# Patient Record
Sex: Female | Born: 1970 | Race: Black or African American | Hispanic: No | Marital: Married
Health system: Southern US, Community
[De-identification: ages and names within clinical notes are randomized; demographics above are authoritative.]

## PROBLEM LIST (undated history)

## (undated) DIAGNOSIS — I10 Essential (primary) hypertension: Secondary | ICD-10-CM

## (undated) DIAGNOSIS — F419 Anxiety disorder, unspecified: Secondary | ICD-10-CM

---

## 2020-07-07 ENCOUNTER — Other Ambulatory Visit: Payer: Self-pay

## 2020-07-07 ENCOUNTER — Emergency Department
Admission: EM | Admit: 2020-07-07 | Discharge: 2020-07-07 | Disposition: A | Discharge status: Home / Self Care | Payer: Medicaid Other | Attending: Emergency Medicine | Admitting: Emergency Medicine

## 2020-07-07 ENCOUNTER — Encounter: Payer: Self-pay | Admitting: Emergency Medicine

## 2020-07-07 ENCOUNTER — Emergency Department: Payer: Medicaid Other

## 2020-07-07 DIAGNOSIS — R0602 Shortness of breath: Secondary | ICD-10-CM | POA: Insufficient documentation

## 2020-07-07 DIAGNOSIS — I10 Essential (primary) hypertension: Secondary | ICD-10-CM | POA: Diagnosis not present

## 2020-07-07 DIAGNOSIS — R42 Dizziness and giddiness: Secondary | ICD-10-CM | POA: Insufficient documentation

## 2020-07-07 DIAGNOSIS — F419 Anxiety disorder, unspecified: Secondary | ICD-10-CM

## 2020-07-07 DIAGNOSIS — R079 Chest pain, unspecified: Secondary | ICD-10-CM | POA: Diagnosis not present

## 2020-07-07 HISTORY — DX: Anxiety disorder, unspecified: F41.9

## 2020-07-07 HISTORY — DX: Essential (primary) hypertension: I10

## 2020-07-07 LAB — COMPREHENSIVE METABOLIC PANEL
ALT: 31 U/L (ref 0–44)
AST: 33 U/L (ref 15–41)
Albumin: 3.7 g/dL (ref 3.5–5.0)
Alkaline Phosphatase: 71 U/L (ref 38–126)
Anion gap: 8 (ref 5–15)
BUN: 13 mg/dL (ref 6–20)
CO2: 27 mmol/L (ref 22–32)
Calcium: 9 mg/dL (ref 8.9–10.3)
Chloride: 103 mmol/L (ref 98–111)
Creatinine, Ser: 0.88 mg/dL (ref 0.44–1.00)
GFR, Estimated: >60 mL/min (ref >60)
Glucose, Bld: 181 mg/dL — ABNORMAL HIGH (ref 70–99)
Potassium: 3.1 mmol/L — ABNORMAL LOW (ref 3.5–5.1)
Sodium: 138 mmol/L (ref 135–145)
Total Bilirubin: 0.4 mg/dL (ref 0.3–1.2)
Total Protein: 7.3 g/dL (ref 6.5–8.1)

## 2020-07-07 LAB — CBC
HCT: 38.9 % (ref 36.0–46.0)
Hemoglobin: 12.6 g/dL (ref 12.0–15.0)
MCH: 24.2 pg — ABNORMAL LOW (ref 26.0–34.0)
MCHC: 32.4 g/dL (ref 30.0–36.0)
MCV: 74.7 fL — ABNORMAL LOW (ref 80.0–100.0)
Platelets: 346 10*3/uL (ref 150–400)
RBC: 5.21 MIL/uL — ABNORMAL HIGH (ref 3.87–5.11)
RDW: 15.8 % — ABNORMAL HIGH (ref 11.5–15.5)
WBC: 13.1 10*3/uL — ABNORMAL HIGH (ref 4.0–10.5)
nRBC: 0 % (ref 0.0–0.2)

## 2020-07-07 LAB — TROPONIN I (HIGH SENSITIVITY): Troponin I (High Sensitivity): 4 ng/L (ref <18)

## 2020-07-07 MED ORDER — LORAZEPAM 2 MG/ML IJ SOLN
1.0000 mg | Freq: Once | INTRAMUSCULAR | Status: AC
  Administered 2020-07-07: 1 mg via INTRAVENOUS
  Filled 2020-07-07: qty 1

## 2020-07-07 NOTE — ED Notes (Signed)
Patient now resting with even and unlabored respirations. Patient reports improvement to SOB.

## 2020-07-07 NOTE — ED Notes (Addendum)
Vital signs stable.  Patient resting, denies pain.

## 2020-07-07 NOTE — ED Notes (Signed)
Patient reports her daughter is here to have her baby, and was only allowed one visitor. The patient reports her daughter chose to allow the baby's father back as the one visitor, causing her distress. The patient reports she started crying, and after crying for a few minutes started to have shortness of breath. The patient reports hx of HTN and anxiety, and reports feeling "panicky." Patient initially very tearful and hyperventilating with this RN. Patient encouraged to take deep breaths, and is noted to improve with emotional support.

## 2020-07-07 NOTE — ED Notes (Signed)
Patient reports improvement to symptoms. Patient requesting to be discharged. Dr. Lenard Lance notified.

## 2020-07-07 NOTE — ED Triage Notes (Signed)
PT was a visitor in labor and delivery when she became shob, denies any recent illness. Pt states hx of the same was told it was htn. Pt also has a hx of anxiety and panic attacks. Pt states no pain at this time.

## 2020-07-07 NOTE — ED Notes (Signed)
Patient ambulatory to restroom. Patient gait steady. Patient respirations even and unlabored. NADN.

## 2020-07-07 NOTE — ED Provider Notes (Signed)
[facility] Emergency Department Provider Note  Time seen: 8:05 PM  I have reviewed the triage vital signs and the nursing notes.   HISTORY  Chief Complaint Panic Attack and Shortness of Breath   HPI Katelyn Mcbride is a 50 y.o. female with a past medical history of anxiety, hypertension, presents emergency department for dizziness and shortness of breath.  According to the patient she was visiting a family member here who just delivered a baby.  Due to the visitor policy patient was not allowed back to see the baby which resulted in distress.  Patient states she was crying and began experiencing shortness of breath and chest tightness and feeling like she is panicking.  Patient states she feels like she is "shaking on the inside."  Patient states a history of anxiety but not currently taking any medications for anxiety.  Denies any chest pain currently.   Past Medical History:  Diagnosis Date   Anxiety    Hypertension     There are no problems to display for this patient.   Prior to Admission medications   Not on File    No Known Allergies  No family history on file.  Social History    Review of Systems Constitutional: Negative for fever. Cardiovascular: Positive for chest tightness largely resolved Respiratory: Positive for shortness of breath.  Patient taking deep breaths. Gastrointestinal: Negative for abdominal pain, vomiting  Musculoskeletal: Negative for musculoskeletal complaints Neurological: Negative for headache All other ROS negative  ____________________________________________   PHYSICAL EXAM:  VITAL SIGNS: ED Triage Vitals  Enc Vitals Group     BP 07/07/20 1943 (!) 185/92     Pulse Rate 07/07/20 1943 82     Resp 07/07/20 1943 (!) 26     Temp 07/07/20 1943 98.4 F (36.9 C)     Temp Source 07/07/20 1943 Oral     SpO2 07/07/20 1943 100 %     Weight 07/07/20 1941 260 lb (117.9 kg)     Height 07/07/20 1941 5\' 8"  (1.727 m)      Head Circumference --      Peak Flow --      Pain Score 07/07/20 1941 0     Pain Loc --      Pain Edu? --      Excl. in GC? --    Constitutional: Alert and oriented. Well appearing and in no distress. Eyes: Normal exam ENT      Head: Normocephalic and atraumatic.      Mouth/Throat: Mucous membranes are moist. Cardiovascular: Normal rate, regular rhythm.  Respiratory: Normal respiratory effort without tachypnea nor retractions. Breath sounds are clear  Gastrointestinal: Soft and nontender. No distention.   Musculoskeletal: Nontender with normal range of motion in all extremities.  Neurologic:  Normal speech and language. No gross focal neurologic deficits  Skin:  Skin is warm, dry and intact.  Psychiatric: Mood and affect are normal.   ____________________________________________    EKG  EKG viewed and interpreted by myself shows a sinus rhythm at 85 bpm with a narrow QRS, normal axis, normal intervals, no concerning ST changes.  ____________________________________________    RADIOLOGY  Chest x-ray shows mild cardiomegaly.  ____________________________________________   INITIAL IMPRESSION / ASSESSMENT AND PLAN / ED COURSE  Pertinent labs & imaging results that were available during my care of the patient were reviewed by me and considered in my medical decision making (see chart for details).   Patient presents emergency department for shortness of breath chest  tightness and possible panic attack.  Patient also found her blood pressure to be elevated upon arrival which is also causing her distress.  Symptoms appear to originate from and the patient was not allowed to visit a patient in the hospital due to the visitor policy.  Here the patient states she feels like she is "shaking on the inside."  States she is feeling "panicky."  We will check labs including cardiac enzymes obtain a chest x-ray EKG and dose IV Ativan while awaiting results.  Differential would include ACS,  metabolic or electrolyte abnormality, anxiety.  Patient is feeling much better.  Lab work is largely nonrevealing.  Troponin is negative.  Patient is asking to be discharged home so she can go visit her family.  Has the patient appears well with reassuring vitals and reassuring work-up I believe the patient is safe for discharge home with outpatient follow-up.  Katelyn Mcbride was evaluated in Emergency Department on 07/07/2020 for the symptoms described in the history of present illness. She was evaluated in the context of the global COVID-19 pandemic, which necessitated consideration that the patient might be at risk for infection with the SARS-CoV-2 virus that causes COVID-19. Institutional protocols and algorithms that pertain to the evaluation of patients at risk for COVID-19 are in a state of rapid change based on information released by regulatory bodies including the CDC and federal and state organizations. These policies and algorithms were followed during the patient's care in the ED.  ____________________________________________   FINAL CLINICAL IMPRESSION(S) / ED DIAGNOSES  Panic attack Anxiety   Minna Antis, MD 07/07/20 2227

## 2020-07-07 NOTE — ED Notes (Signed)
Portable chest x-ray at bedside at this time. 

## 2020-07-07 NOTE — ED Notes (Addendum)
Patient provided all discharge instructions, including follow up care. Patient respirations even and unlabored. Patient ambulatory with steady gait. Patient discharged to lobby and shown by Corrie Dandy, ED NT how to get to L&D.

## 2020-07-07 NOTE — ED Notes (Signed)
ED Provider at bedside. 

## 2020-07-07 NOTE — ED Notes (Signed)
Lab at bedside for recollect on CMP/Trop.

## 2020-07-07 NOTE — ED Notes (Signed)
Patient is resting comfortably. 

## 2021-12-29 IMAGING — DX DG CHEST 1V PORT
1 series · 1 of 1 positions shown · non-contrast
Comparison: None.

CLINICAL DATA: 49-year-old female with shortness of breath.

EXAM:
PORTABLE CHEST 1 VIEW

[chest ap]
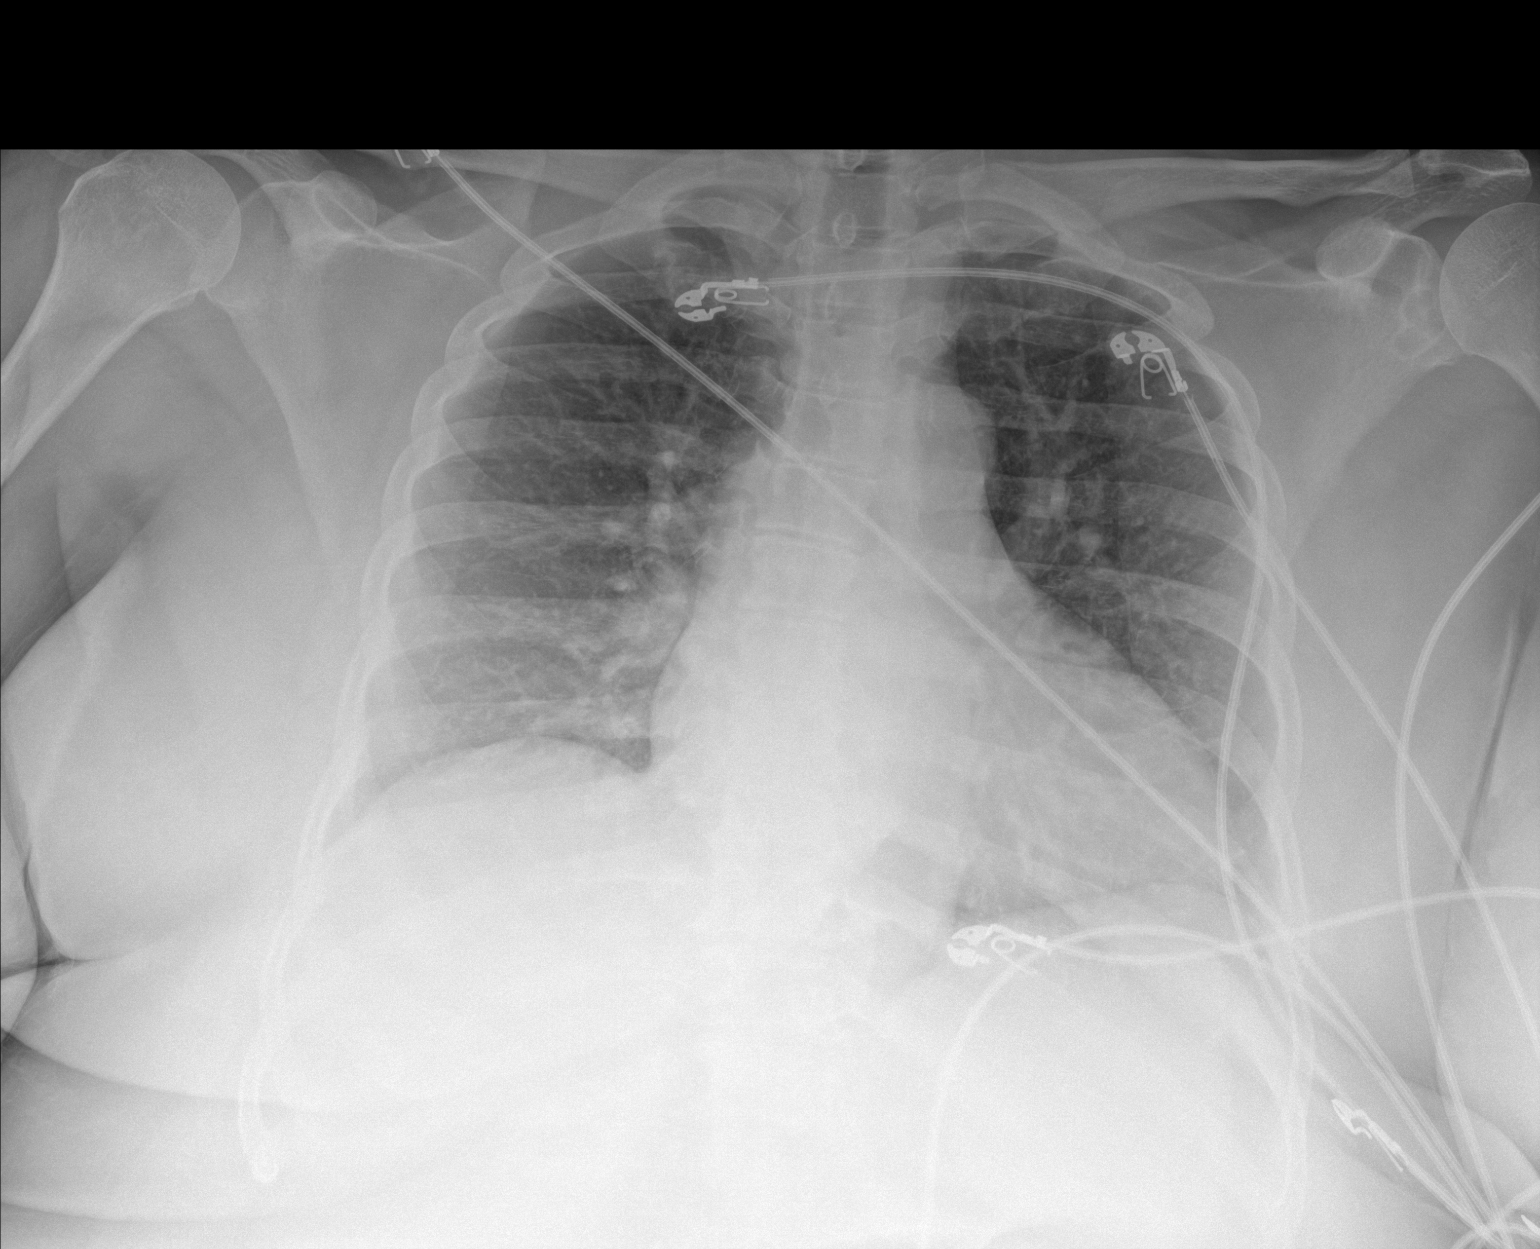

[1 of 1 positions shown; findings below may reference images not displayed]

FINDINGS: Shallow inspiration. Mild cardiomegaly with mild central vascular
congestion. No focal consolidation, pleural effusion, or
pneumothorax. No acute osseous pathology.
IMPRESSION: Mild cardiomegaly with mild central vascular congestion. No focal
consolidation.
